# Patient Record
Sex: Female | Born: 2000 | Race: Black or African American | Hispanic: No | Marital: Single | Smoking: Never smoker
Health system: Southern US, Community
[De-identification: ages and names within clinical notes are randomized; demographics above are authoritative.]

## PROBLEM LIST (undated history)

## (undated) DIAGNOSIS — J353 Hypertrophy of tonsils with hypertrophy of adenoids: Secondary | ICD-10-CM

---
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
## 2024-07-22 ENCOUNTER — Inpatient Hospital Stay

## 2024-07-22 ENCOUNTER — Inpatient Hospital Stay: Admitting: Genetic Counselor

## 2024-09-18 ENCOUNTER — Inpatient Hospital Stay: Attending: Hematology and Oncology | Admitting: Genetic Counselor

## 2024-09-18 ENCOUNTER — Inpatient Hospital Stay

## 2024-09-18 ENCOUNTER — Encounter: Payer: Self-pay | Admitting: Genetic Counselor

## 2024-09-18 DIAGNOSIS — Z1379 Encounter for other screening for genetic and chromosomal anomalies: Secondary | ICD-10-CM | POA: Diagnosis not present

## 2024-09-18 DIAGNOSIS — Z8481 Family history of carrier of genetic disease: Secondary | ICD-10-CM

## 2024-09-18 LAB — GENETIC SCREENING ORDER

## 2024-09-18 NOTE — Progress Notes (Signed)
 REFERRING PROVIDER: [facility] [address] [place],  [place] [address]  PRIMARY PROVIDER:  [facility]  PRIMARY REASON FOR VISIT:  1. Family history of BRCA1 gene positive      HISTORY OF PRESENT ILLNESS:   Gloria Perry, a 24 y.o. female, was seen for a [facility] cancer genetics consultation at the request of [facility] due to a family history of breast and ovarian cancer in her mother and a pathogenic BRCA1 variant.  Gloria Perry presents to clinic today to discuss the possibility of a hereditary predisposition to cancer, to discuss genetic testing, and to further clarify her future cancer risks, as well as potential cancer risks for family members.   Gloria Perry is a 24 y.o. female with no personal history of cancer.    RELEVANT MEDICAL HISTORY:  Menarche was at age 3.  Nulliparous.  OCP use for approximately 0 years. Patch for less than one year.  Ovaries intact: yes.  Uterus intact: yes.  Menopausal status: premenopausal.  HRT use: 0 years. Colonoscopy: no; not examined. Mammogram within the last year: no. Number of breast biopsies: 0. [facility] PCP Gloria Perry at [facility]   Past Medical History:  Diagnosis Date   Tonsillar and adenoid hypertrophy 04/2012   snores during sleep, occ. stops breathing, wakes up coughing, per mother    Past Surgical History:  Procedure Laterality Date   TONSILLECTOMY AND ADENOIDECTOMY  04/23/2012   Procedure: TONSILLECTOMY AND ADENOIDECTOMY;  Surgeon: Ana LELON Moccasin, MD;  Location: [facility];  Service: ENT;  Laterality: N/A;  tonsillectomy and adenoidectomy    Social History   Socioeconomic History   Marital status: Single    Spouse name: Not on file   Number of children: Not on file   Years of education: Not on file   Highest education level: Not on file  Occupational History   Not on file  Tobacco Use   Smoking status: Never   Smokeless  tobacco: Never  Substance and Sexual Activity   Alcohol use: No   Drug use: No   Sexual activity: Never  Other Topics Concern   Not on file  Social History Narrative   Not on file   Social Drivers of Health   Tobacco Use: Low Risk (09/18/2024)   Patient History    Smoking Tobacco Use: Never    Smokeless Tobacco Use: Never    Passive Exposure: Not on file  Financial Resource Strain: Not on file  Food Insecurity: Not on file  Transportation Needs: Not on file  Physical Activity: Not on file  Stress: Not on file  Social Connections: Not on file  Depression (EYV7-0): Not on file  Alcohol Screen: Not on file  Housing: Not on file  Utilities: Not on file  Health Literacy: Not on file     FAMILY HISTORY:  We obtained a detailed, 4-generation family history.  Significant diagnoses are listed below: Family History  Problem Relation Age of Onset   Breast cancer Mother 46   Ovarian cancer Mother 22   BRCA 1/2 Mother     Gloria Perry is aware of previous family history of genetic testing for hereditary cancer risks, with her mom testing positive for a pathogenic variant in BRCA1, [id number] ([id number]). There is no reported Ashkenazi Jewish ancestry.   GENETIC COUNSELING ASSESSMENT: Gloria Perry is a 24 y.o. female with a family history of cancer and a BRCA1 pathogenic  variant, which puts her at a 1/2 or 50% chance to have a hereditary cancer predisposition. We, therefore, discussed and recommended the following at today's visit.   DISCUSSION: We discussed that 5 - 10% of cancer is hereditary, with most cases of hereditary breast and ovarian cancer associated with pathogenic variants in BRCA1/2.    During the visit, we reviewed the natural history of BRCA1 mutations and increased risk for cancer including breast, female breast, ovarian, pancreatic, and prostate. We also discussed how the identification for a BRCA1 mutation would impact Gloria Perry's medical care, per current NCCN  guidelines. We reviewed that BRCA1 mutations are inherited in an autosomal dominant pattern, meaning that children, siblings and parents of individuals with a BRCA1 mutation have a 1/2 or 50% chance to have the mutation as well. Both males and females can inherit a BRCA1 mutation and could pass it on to their children.   Additionally, for any family members who are of childbearing age, identification of a BRCA1 mutation may have reproductive implications. In rare cases a child may inherit a BRCA1 mutation from each parent and have a condition called Fanconi Anemia (FA). FA is a childhood onset condition that can be associated with developmental differences, childhood cancer and bone marrow failure, among other health concerns. Therefore, if anyone of childbearing age is found to have a BRCA1 mutation, testing of their reproductive partner should be considered to clarify the chance to have a child with FA.  We discussed that testing is beneficial for several reasons, including knowing about other cancer risks, identifying potential screening and risk-reduction options that may be appropriate, and to understanding if other family members could be at risk for cancer and allowing them to undergo genetic testing.  We reviewed the characteristics, features and inheritance patterns of hereditary cancer syndromes. We also discussed genetic testing, including the appropriate family members to test, the process of testing, insurance coverage and turn-around-time for results. We discussed the implications of a negative, positive, carrier and/or variant of uncertain significant result. We discussed that negative results would be uninformative given that Gloria Perry does not have a personal history of cancer. We recommended Gloria Perry pursue genetic testing for the BRCA1 variant identified in her relative.  Gloria Perry was offered a single site analysis of BRCA1 [id number] [phone]), a common hereditary cancer panel (40+  genes) and an expanded pan-cancer panel (70+ genes). Gloria Perry was informed of the benefits and limitations of each panel, including that expanded pan-cancer panels contain several genes that do not have clear management guidelines at this point in time.  We also discussed that as the number of genes included on a panel increases, the chances of variants of uncertain significance increases.  After considering the benefits and limitations of each gene panel, Gloria Perry elected to have single site analysis for [id number] ([id number]) in BRCA1.   Based on Gloria Perry's family history of cancer, she meets medical criteria for genetic testing. Despite that she meets criteria, she may still have an out of pocket cost. We discussed that if her out of pocket cost for testing is over $100, the laboratory should contact them to discuss self-pay prices, patient pay assistance programs, if applicable, and other billing options.  We discussed that some people do not want to undergo genetic testing due to fear of genetic discrimination.  A federal law called the Genetic Information Non-Discrimination Act (GINA) of 2008 helps protect individuals against genetic discrimination based on their genetic test results.  It impacts  both health insurance and employment.  With health insurance, it protects against increased premiums, being kicked off insurance or being forced to take a test in order to be insured.  For employment it protects against hiring, firing and promoting decisions based on genetic test results.  GINA does not apply to those in the [employer], those who work for companies with less than 15 employees, and new life insurance or long-term disability insurance policies.  Health status due to a cancer diagnosis is not protected under GINA.  PLAN: After considering the risks, benefits, and limitations, Gloria Perry provided informed consent to pursue genetic testing and the blood sample was sent to [facility] for  analysis for the [id number] ([id number].[id number]) pathogenic variant in BRCA1. Results should be available within approximately 2-3 weeks' time, at which point they will be disclosed by telephone to Gloria Perry, as will any additional recommendations warranted by these results. Gloria Perry will receive a summary of her genetic counseling visit and a copy of her results once available. This information will also be available in Epic.   Lastly, we encouraged Gloria Perry to remain in contact with cancer genetics annually so that we can continuously update the family history and inform her of any changes in cancer genetics and testing that may be of benefit for this family.   Gloria Perry questions were answered to her satisfaction today. Our contact information was provided should additional questions or concerns arise. Thank you for the referral and allowing us  to share in the care of your patient.   Burnard Ogren, MS, [facility] Licensed, [email or web address] phone: [phone]   40 minutes were spent on the date of the encounter in service to the patient including preparation, face-to-face consultation, documentation and care coordination.  The patient was seen alone.  Drs. Gudena and/or Lanny were available to discuss this case as needed.  _______________________________________________________________________ For Office Staff:  Number of people involved in session: 1 Was an Intern/ student involved with case: no

## 2024-10-01 ENCOUNTER — Ambulatory Visit: Payer: Self-pay | Admitting: Genetic Counselor

## 2024-10-01 DIAGNOSIS — Z1379 Encounter for other screening for genetic and chromosomal anomalies: Secondary | ICD-10-CM

## 2024-10-02 DIAGNOSIS — Z1379 Encounter for other screening for genetic and chromosomal anomalies: Secondary | ICD-10-CM | POA: Insufficient documentation

## 2024-10-02 NOTE — Progress Notes (Signed)
 HPI:  Gloria Perry was previously seen in the [facility] clinic for cascade testing due to a family history of a pathogenic variant in BRCA1, [id number]. Please refer to our prior cancer genetics clinic note for more information regarding our discussion, assessment and recommendations at the time. Gloria Perry's recent genetic test results were disclosed to them, as were recommendations warranted by these results. These results and recommendations are discussed in more detail below.  Results are negative, Gloria Perry did not inherit the BRCA1 variant that had been reported for their relatives. Results were disclosed via telephone on 10/01/24.   FAMILY HISTORY:  We obtained a detailed, 4-generation family history.  Significant diagnoses are listed below: Family History  Problem Relation Age of Onset   Breast cancer Mother 33   Ovarian cancer Mother 35   BRCA 1/2 Mother       GENETIC TEST RESULTS: Genetic testing reported out on 09/27/24 through the [employer] of BRCA1. The BRCA1 variant that had been identified in Gloria Perry's family was not identified on this test. The test report has been scanned into EPIC and is located under the Molecular Pathology section of the Results Review tab.  A portion of the result report is included below for reference.     We discussed that testing did not evaluate for any additional pathogenic variants within the BRCA1 gene or for variants in any other genes associated with hereditary cancer.   There are other genes that are associated with increased cancer risk that can be analyzed. Should Gloria Perry wish to pursue additional genetic testing, we are happy to discuss and coordinate this testing, at any time.    CANCER SCREENING RECOMMENDATIONS: Gloria Perry's test result is considered negative (normal).  This means that they did not inherit the pathogenic BRCA1 variant reported in their family members. Therefore,  Gloria Perry is not expected to be at increased risk of BRCA1-related cancers due to this specific alteration.   We recommended Gloria Perry pursue testing for the familial hereditary cancer gene mutation called BRCA1,  [id number] ([id number]). Gloria Perry's test was normal and did not reveal the familial mutation. We call this result a true negative result because the cancer-causing mutation was identified in Gloria Perry's family, and they did not inherit it.  Given this negative result, Gloria Perry's chances of developing BRCA1-related cancers are the same as they are in the general population.    It is recommended she continue to follow the cancer management and screening guidelines provided by her primary healthcare provider, with recommendations guided by personal history, any family history unrelated to the BRCA1 variant, and population screening guidelines. An individual's cancer risk and medical management are not determined by genetic test results alone. Overall cancer risk assessment incorporates additional factors, including personal medical history, family history, and any available genetic information that may result in a personalized plan for cancer prevention and surveillance  We recommended women in this family have a yearly mammogram beginning at age 6, or 35 years younger than the earliest onset of cancer, an annual clinical breast exam, and perform monthly breast self-exams. Women in this family should also have a gynecological exam as recommended by their primary provider. All family members should be referred for colonoscopy starting at age  34, or 99 years younger than the earliest onset of cancer.  RECOMMENDATIONS FOR FAMILY MEMBERS:   Genetic testing for the BRCA1 variant is recommended for Gloria Perry's maternal siblings. They have a 1/2  chance to carry the same BRCA1 variant as her mom. Cancer screening and prevention recommendations should be guided by the family  history if no testing is completed, or by presence/absence of the BRCA1 variant if genetic testing is completed.   If [employer] has children, they will not need testing for BRCA1 based on [name]'s family history, given that [name] did not inherit the BRCA1 pathogenic variant. However, paternal history should still be considered when making testing decisions.   FOLLOW-UP: Lastly, we discussed with Gloria Perry that cancer genetics is a rapidly advancing field and it is possible that new genetic tests will be appropriate for her and/or her family members in the future. We encouraged her to remain in contact with cancer genetics on an annual basis so we can update her personal and family histories and let her know of advances in cancer genetics that may benefit this family.   Our contact number was provided. Gloria Perry's questions were answered to her satisfaction, and she knows she is welcome to call us  at anytime with additional questions or concerns.   Gloria Ogren, MS, [facility] Licensed, [name] [phone]
